# Patient Record
Sex: Male | Born: 1985 | Race: Black or African American | Hispanic: No | Marital: Single | Smoking: Never smoker
Health system: Southern US, Community
[De-identification: ages and names within clinical notes are randomized; demographics above are authoritative.]

## PROBLEM LIST (undated history)

## (undated) DIAGNOSIS — I10 Essential (primary) hypertension: Secondary | ICD-10-CM

## (undated) DIAGNOSIS — S82892A Other fracture of left lower leg, initial encounter for closed fracture: Secondary | ICD-10-CM

---
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
## 2006-04-14 ENCOUNTER — Emergency Department (HOSPITAL_COMMUNITY): Admission: EM | Admit: 2006-04-14 | Discharge: 2006-04-14 | Payer: Self-pay | Admitting: Emergency Medicine

## 2012-02-23 ENCOUNTER — Emergency Department (HOSPITAL_COMMUNITY): Payer: Self-pay

## 2012-02-23 ENCOUNTER — Encounter (HOSPITAL_COMMUNITY): Payer: Self-pay

## 2012-02-23 ENCOUNTER — Emergency Department (HOSPITAL_COMMUNITY)
Admission: EM | Admit: 2012-02-23 | Discharge: 2012-02-23 | Disposition: A | Discharge status: Home / Self Care | Payer: Self-pay | Attending: Emergency Medicine | Admitting: Emergency Medicine

## 2012-02-23 DIAGNOSIS — M25473 Effusion, unspecified ankle: Secondary | ICD-10-CM | POA: Insufficient documentation

## 2012-02-23 DIAGNOSIS — X58XXXA Exposure to other specified factors, initial encounter: Secondary | ICD-10-CM | POA: Insufficient documentation

## 2012-02-23 DIAGNOSIS — M25579 Pain in unspecified ankle and joints of unspecified foot: Secondary | ICD-10-CM | POA: Insufficient documentation

## 2012-02-23 DIAGNOSIS — S99929A Unspecified injury of unspecified foot, initial encounter: Secondary | ICD-10-CM | POA: Insufficient documentation

## 2012-02-23 DIAGNOSIS — Y9367 Activity, basketball: Secondary | ICD-10-CM | POA: Insufficient documentation

## 2012-02-23 DIAGNOSIS — Y9239 Other specified sports and athletic area as the place of occurrence of the external cause: Secondary | ICD-10-CM | POA: Insufficient documentation

## 2012-02-23 DIAGNOSIS — M25476 Effusion, unspecified foot: Secondary | ICD-10-CM | POA: Insufficient documentation

## 2012-02-23 DIAGNOSIS — S93409A Sprain of unspecified ligament of unspecified ankle, initial encounter: Secondary | ICD-10-CM | POA: Insufficient documentation

## 2012-02-23 DIAGNOSIS — S8990XA Unspecified injury of unspecified lower leg, initial encounter: Secondary | ICD-10-CM | POA: Insufficient documentation

## 2012-02-23 HISTORY — DX: Other fracture of left lower leg, initial encounter for closed fracture: S82.892A

## 2012-02-23 MED ORDER — IBUPROFEN 600 MG PO TABS: 600.0000 mg | ORAL_TABLET | Freq: Four times a day (QID) | ORAL | Status: AC | PRN

## 2012-02-23 MED ORDER — IBUPROFEN 200 MG PO TABS
600.0000 mg | ORAL_TABLET | Freq: Once | ORAL | Status: AC
  Administered 2012-02-23: 600 mg via ORAL
  Filled 2012-02-23: qty 3

## 2012-02-23 NOTE — Discharge Instructions (Signed)
Take motrin as need for pain. Elevate foot/ankle. Icepack/cold to sore area. Wear brace for comfort/support for the next few days.  Follow up with primary care doctor in 1 week if symptoms fail to improve/resolve.  Your blood pressure is high today - follow up with primary care doctor in 1-2 weeks.  Return to ER if worse, severe pain, new symptoms, other concern.      Ankle Sprain An ankle sprain is an injury to the strong, fibrous tissues (ligaments) that hold the bones of your ankle joint together.  CAUSES Ankle sprain usually is caused by a fall or by twisting your ankle. People who participate in sports are more prone to these types of injuries.  SYMPTOMS  Symptoms of ankle sprain include:  Pain in your ankle. The pain may be present at rest or only when you are trying to stand or walk.   Swelling.   Bruising. Bruising may develop immediately or within 1 to 2 days after your injury.   Difficulty standing or walking.  DIAGNOSIS  Your caregiver will ask you details about your injury and perform a physical exam of your ankle to determine if you have an ankle sprain. During the physical exam, your caregiver will press and squeeze specific areas of your foot and ankle. Your caregiver will try to move your ankle in certain ways. An X-ray exam may be done to be sure a bone was not broken or a ligament did not separate from one of the bones in your ankle (avulsion).  TREATMENT  Certain types of braces can help stabilize your ankle. Your caregiver can make a recommendation for this. Your caregiver may recommend the use of medication for pain. If your sprain is severe, your caregiver may refer you to a surgeon who helps to restore function to parts of your skeletal system (orthopedist) or a physical therapist. HOME CARE INSTRUCTIONS  Apply ice to your injury for 1 to 2 days or as directed by your caregiver. Applying ice helps to reduce inflammation and pain.  Put ice in a plastic bag.    Place a towel between your skin and the bag.   Leave the ice on for 15 to 20 minutes at a time, every 2 hours while you are awake.   Take over-the-counter or prescription medicines for pain, discomfort, or fever only as directed by your caregiver.   Keep your injured leg elevated, when possible, to lessen swelling.   If your caregiver recommends crutches, use them as instructed. Gradually, put weight on the affected ankle. Continue to use crutches or a cane until you can walk without feeling pain in your ankle.   If you have a plaster splint, wear the splint as directed by your caregiver. Do not rest it on anything harder than a pillow the first 24 hours. Do not put weight on it. Do not get it wet. You may take it off to take a shower or bath.   You may have been given an elastic bandage to wear around your ankle to provide support. If the elastic bandage is too tight (you have numbness or tingling in your foot or your foot becomes cold and blue), adjust the bandage to make it comfortable.   If you have an air splint, you may blow more air into it or let air out to make it more comfortable. You may take your splint off at night and before taking a shower or bath.   Wiggle your toes in the splint several  times per day if you are able.  SEEK MEDICAL CARE IF:   You have an increase in bruising, swelling, or pain.   Your toes feel cold.   Pain relief is not achieved with medication.  SEEK IMMEDIATE MEDICAL CARE IF: Your toes are numb or blue or you have severe pain. MAKE SURE YOU:   Understand these instructions.   Will watch your condition.   Will get help right away if you are not doing well or get worse.  Document Released: 09/29/2005 Document Revised: 09/18/2011 Document Reviewed: 05/03/2008 [facility] Patient Information 2012 [place], [place].     Hypertension As your heart beats, it forces blood through your arteries. This force is your blood pressure. If the pressure is  too high, it is called hypertension (HTN) or high blood pressure. HTN is dangerous because you may have it and not know it. High blood pressure may mean that your heart has to work harder to pump blood. Your arteries may be narrow or stiff. The extra work puts you at risk for heart disease, stroke, and other problems.  Blood pressure consists of two numbers, a higher number over a lower, 110/72, for example. It is stated as "110 over 72." The ideal is below 120 for the top number (systolic) and under 80 for the bottom (diastolic). Write down your blood pressure today. You should pay close attention to your blood pressure if you have certain conditions such as:  Heart failure.   Prior heart attack.   Diabetes   Chronic kidney disease.   Prior stroke.   Multiple risk factors for heart disease.  To see if you have HTN, your blood pressure should be measured while you are seated with your arm held at the level of the heart. It should be measured at least twice. A one-time elevated blood pressure reading (especially in the Emergency Department) does not mean that you need treatment. There may be conditions in which the blood pressure is different between your right and left arms. It is important to see your caregiver soon for a recheck. Most people have essential hypertension which means that there is not a specific cause. This type of high blood pressure may be lowered by changing lifestyle factors such as:  Stress.   Smoking.   Lack of exercise.   Excessive weight.   Drug/tobacco/alcohol use.   Eating less salt.  Most people do not have symptoms from high blood pressure until it has caused damage to the body. Effective treatment can often prevent, delay or reduce that damage. TREATMENT  When a cause has been identified, treatment for high blood pressure is directed at the cause. There are a large number of medications to treat HTN. These fall into several categories, and your caregiver  will help you select the medicines that are best for you. Medications may have side effects. You should review side effects with your caregiver. If your blood pressure stays high after you have made lifestyle changes or started on medicines,   Your medication(s) may need to be changed.   Other problems may need to be addressed.   Be certain you understand your prescriptions, and know how and when to take your medicine.   Be sure to follow up with your caregiver within the time frame advised (usually within two weeks) to have your blood pressure rechecked and to review your medications.   If you are taking more than one medicine to lower your blood pressure, make sure you know how and  at what times they should be taken. Taking two medicines at the same time can result in blood pressure that is too low.  SEEK IMMEDIATE MEDICAL CARE IF:  You develop a severe headache, blurred or changing vision, or confusion.   You have unusual weakness or numbness, or a faint feeling.   You have severe chest or abdominal pain, vomiting, or breathing problems.  MAKE SURE YOU:   Understand these instructions.   Will watch your condition.   Will get help right away if you are not doing well or get worse.  Document Released: 09/29/2005 Document Revised: 09/18/2011 Document Reviewed: 05/19/2008 [facility] Patient Information 2012 [place], [place].

## 2012-02-23 NOTE — ED Provider Notes (Signed)
History     CSN: [id number]  Arrival date & time 02/23/12  1219   First MD Initiated Contact with Patient 02/23/12 1335      Chief Complaint  Patient presents with  . Ankle Injury    (Consider location/radiation/quality/duration/timing/severity/associated sxs/prior treatment) Patient is a 26 y.o. male presenting with lower extremity injury. The history is provided by the patient.  Ankle Injury  pt c/o left ankle injury yesterday playing basketball. Constant, dull, non radiating, pain laterally. Worse w walking and palpation. No knee pain. Denies other injury. Skin intact. No numbness/weakness.     Past Medical History  Diagnosis Date  . Ankle fracture, left     History reviewed. No pertinent past surgical history.  History reviewed. No pertinent family history.  History  Substance Use Topics  . Smoking status: Never Smoker   . Smokeless tobacco: Not on file  . Alcohol Use: No      Review of Systems  Constitutional: Negative for fever.  Skin: Negative for wound.  Neurological: Negative for numbness.    Allergies  Review of patient's allergies indicates no known allergies.  Home Medications   Current Outpatient Rx  Name Route Sig Dispense Refill  . IBUPROFEN 200 MG PO TABS Oral Take 800 mg by mouth every 6 (six) hours as needed. pain    . ADULT MULTIVITAMIN W/MINERALS CH Oral Take 1 tablet by mouth daily.      BP 149/98  Pulse 76  Temp(Src) 98.2 F (36.8 C) (Oral)  Resp 12  SpO2 96%  Physical Exam  Nursing note and vitals reviewed. Constitutional: He appears well-developed and well-nourished. No distress.  HENT:  Head: Atraumatic.  Neck: Neck supple. No tracheal deviation present.  Cardiovascular: Normal rate.   Pulmonary/Chest: Effort normal. No accessory muscle usage. No respiratory distress.  Musculoskeletal: Normal range of motion.       sts and tenderness lateral malleolus left ankle. Dp/pt 2+. Normal cap refill distally. Ankle stable. No  tenderness 5th nt. No prox tib fib or knee tenderness. Good rom.   Neurological: He is alert.       Left foot nvi.   Skin: Skin is warm and dry.  Psychiatric: He has a normal mood and affect.    ED Course  Procedures (including critical care time)  Labs Reviewed - No data to display Dg Ankle Complete Left  02/23/2012  *RADIOLOGY REPORT*  Clinical Data: Twisted.  Pain and swelling.  LEFT ANKLE COMPLETE - 3+ VIEW  Comparison: 04/14/2006  Findings: There is a flat foot.  There are some degenerative changes in the midfoot.  There is some calcification projected over the lateral calcaneal region that probably relates to an old healed lateral ligamentous injury.  No acute fracture is evident.  IMPRESSION: No acute fracture.  Calcification lateral to the calcaneus probably relates to old healed lateral ligamentous injury.  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  Original Report Authenticated By: Thomasenia Sales, M.D.        MDM  Xrays. Icepack. Aso. Discussed xrays w pt.   Motrin po.           Suzi Roots, MD 02/23/12 [phone]

## 2012-02-23 NOTE — ED Notes (Signed)
Pt. States he was playing basketball yesterday and "rolled my ankle".  Left ankle swollen and tender to touch. Pt. Reports using ice, ibuprofen, and elevation without relief.

## 2013-09-27 IMAGING — CR DG ANKLE COMPLETE 3+V*L*
3 series · 3 of 3 positions shown · non-contrast
Comparison: 04/14/2006

CLINICAL DATA: Twisted.  Pain and swelling.

LEFT ANKLE COMPLETE - 3+ VIEW

[x ankle ap left]
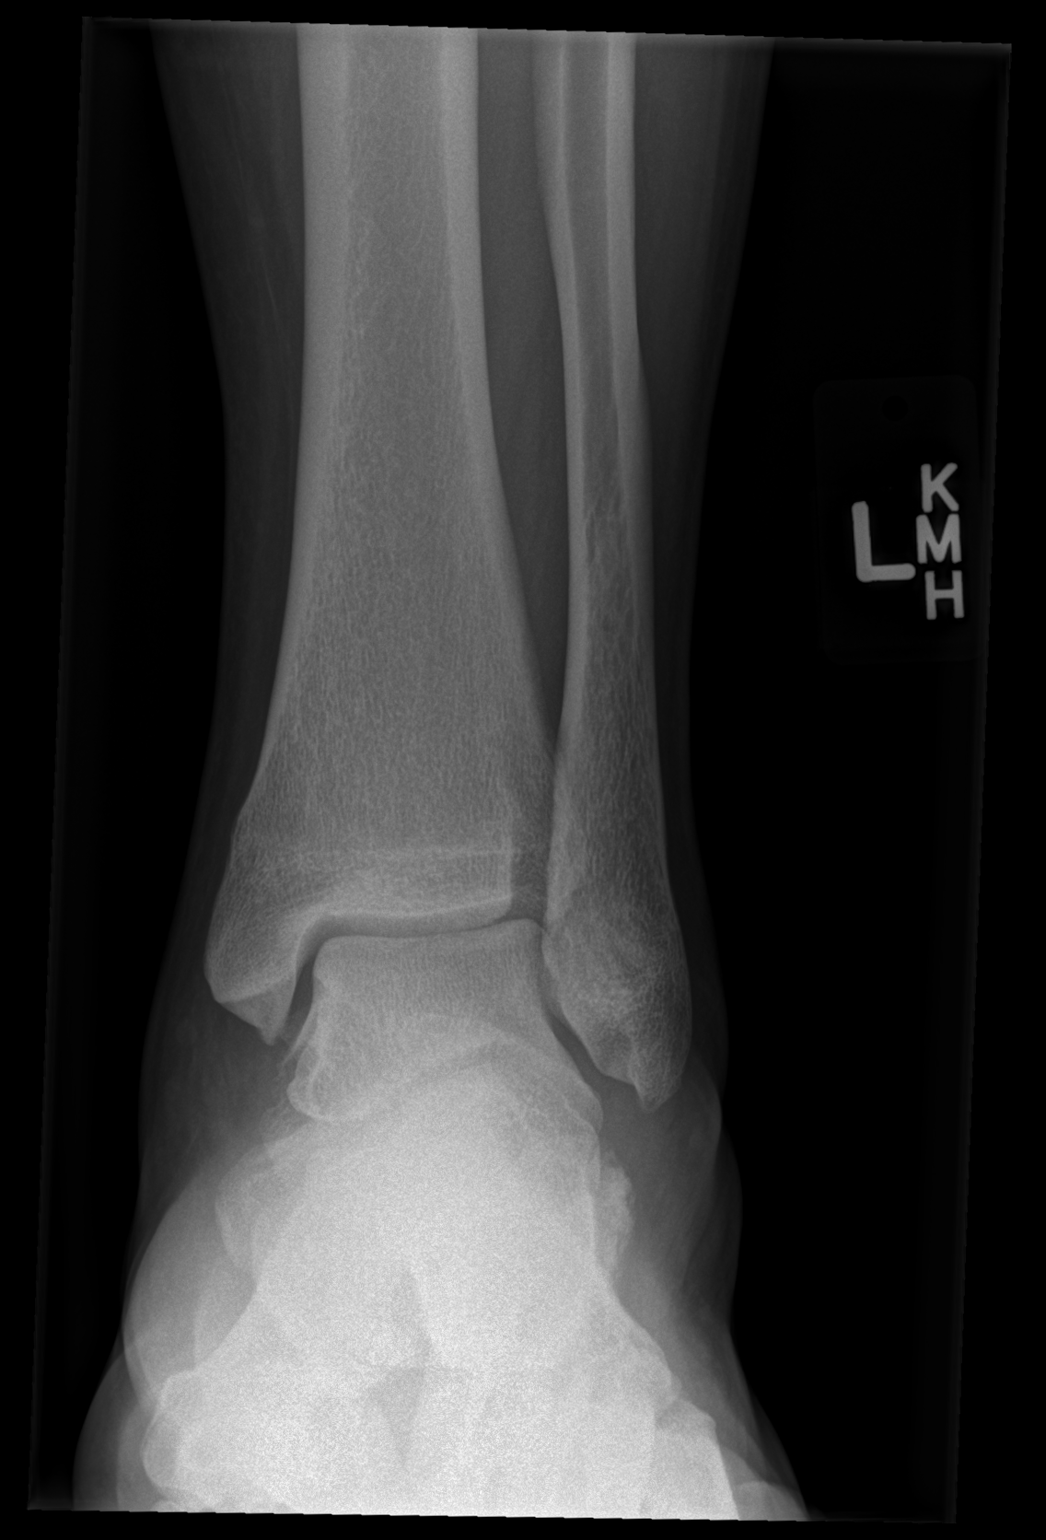

[x ankle obl left]
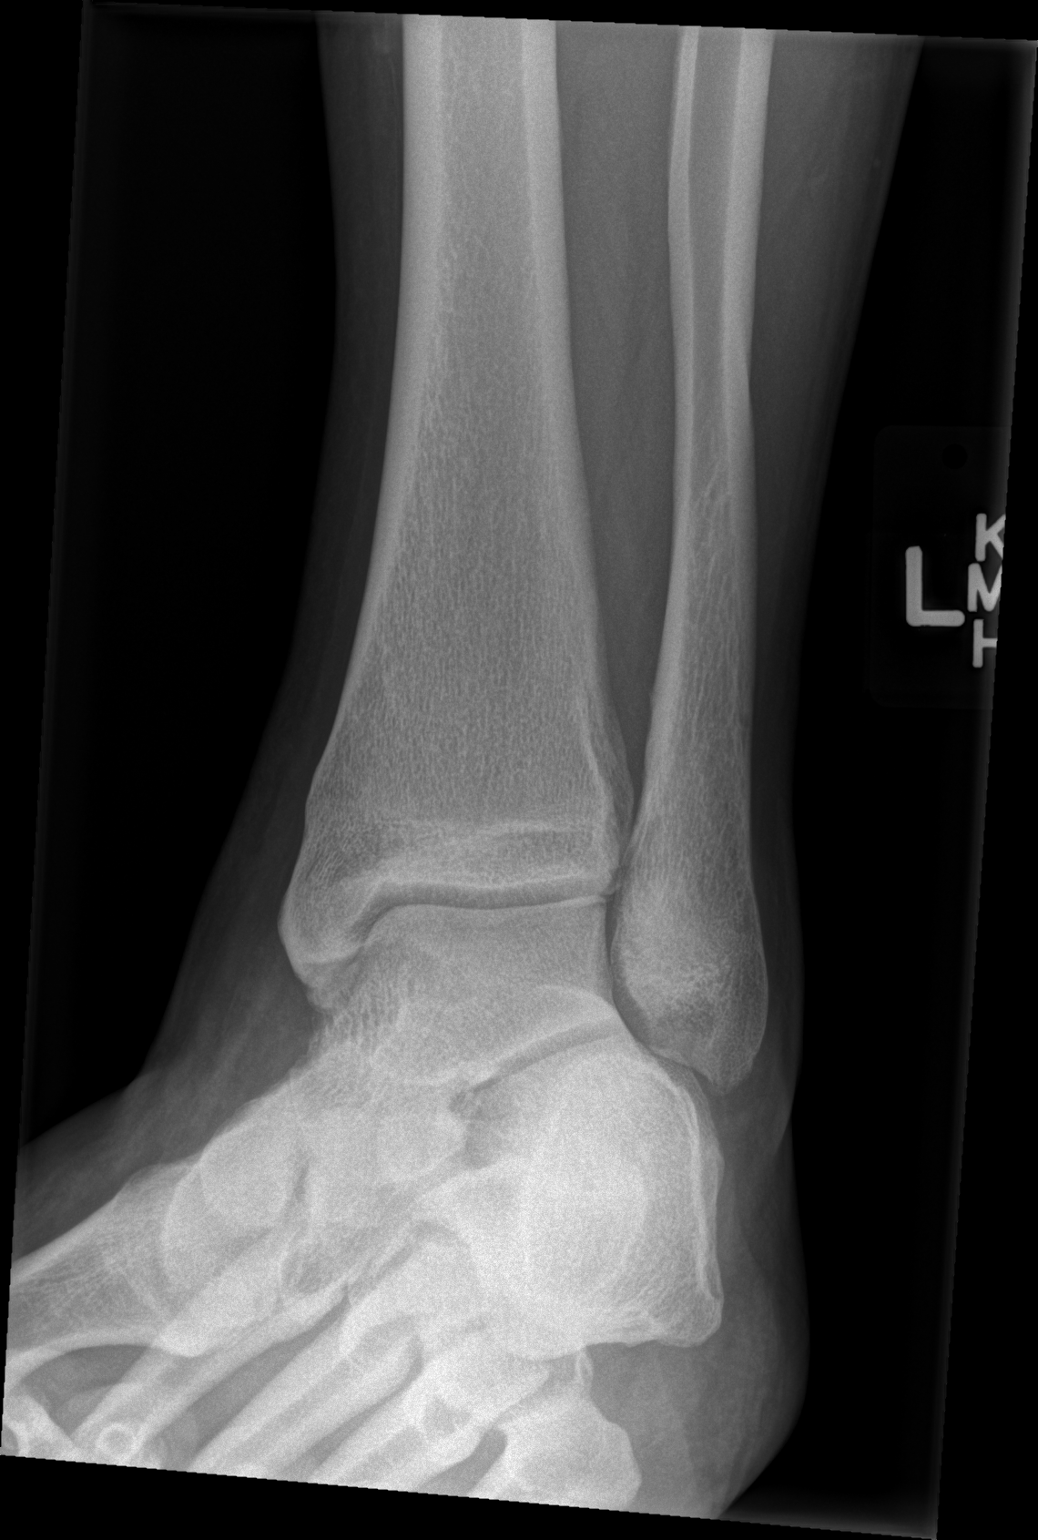

[x ankle lat left]
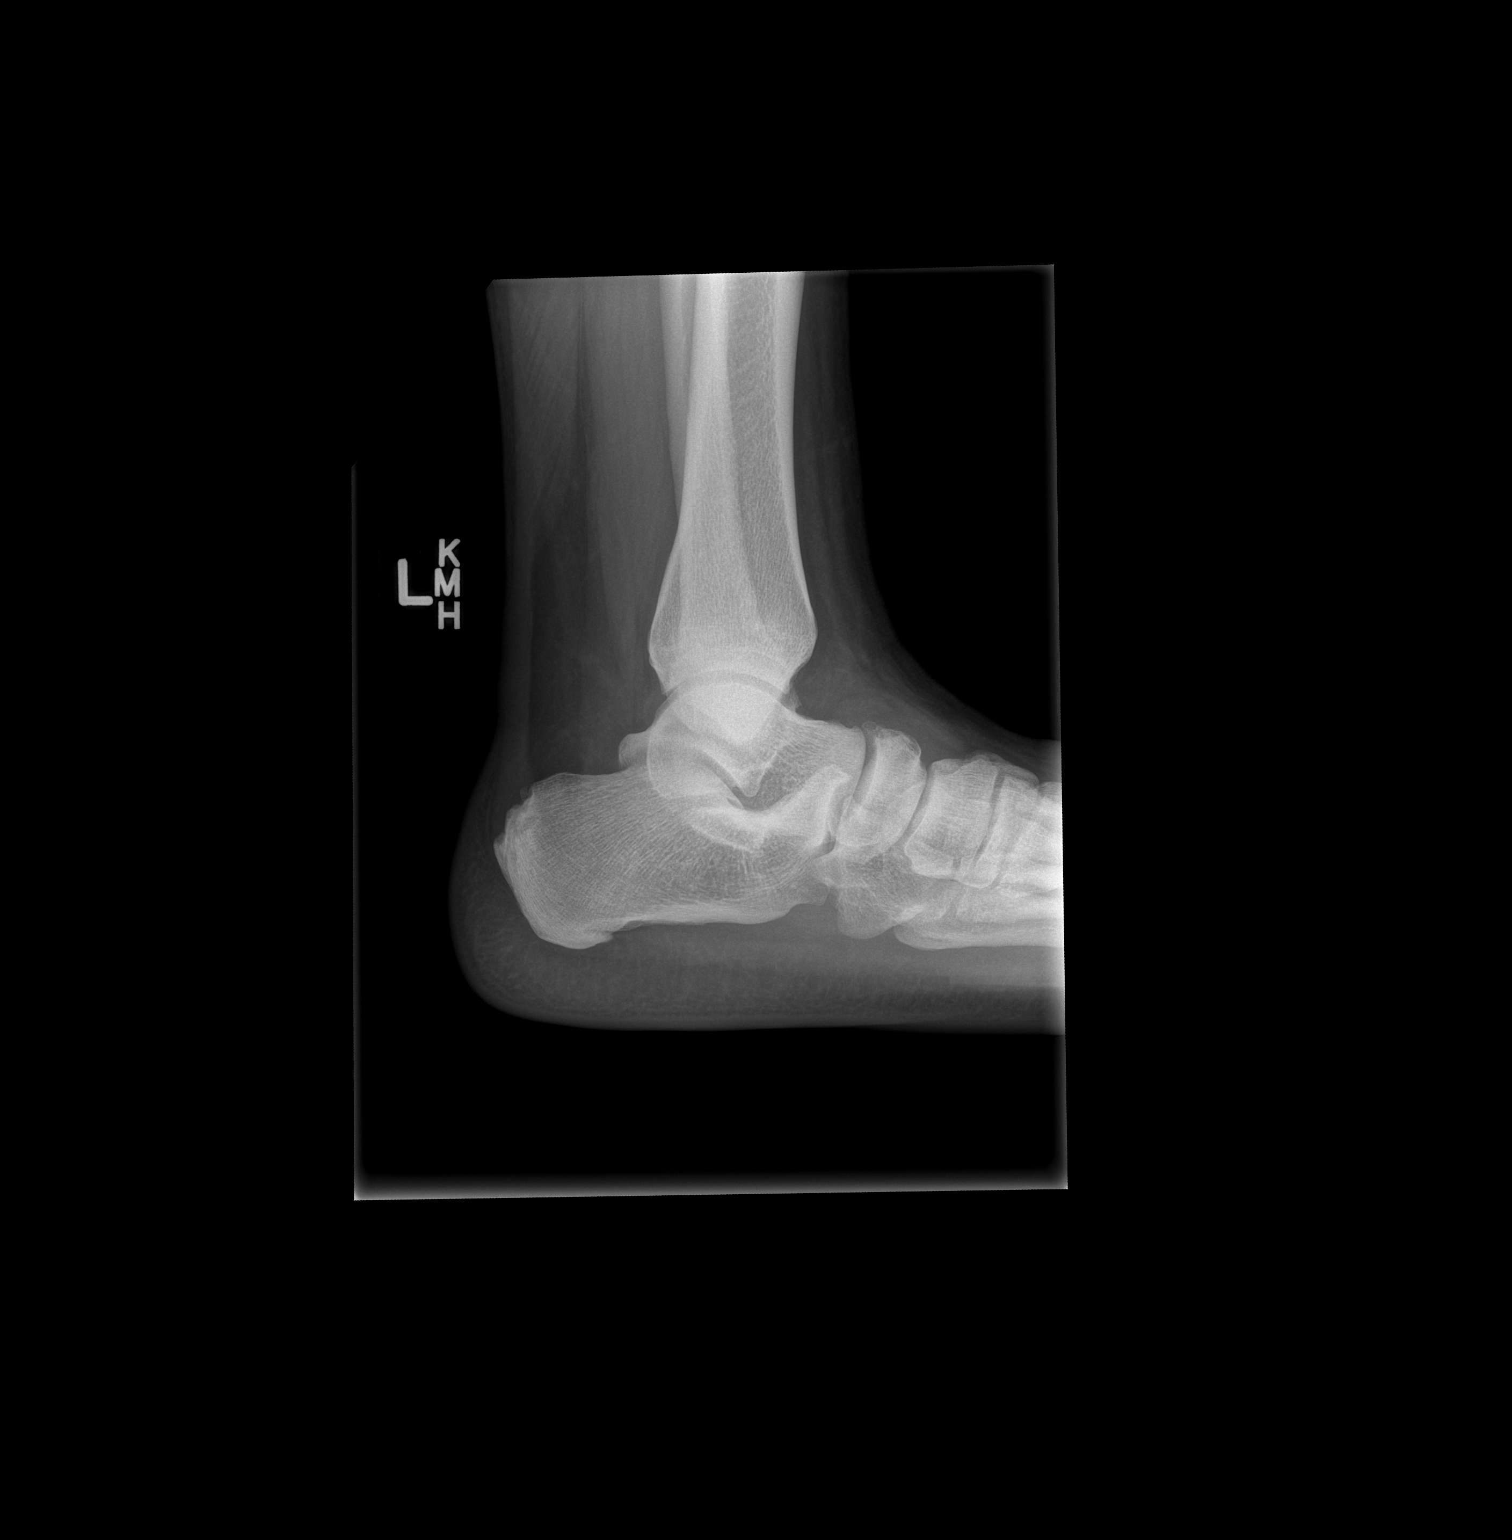

[3 of 3 positions shown; findings below may reference images not displayed]

FINDINGS: There is a flat foot.  There are some degenerative
changes in the midfoot.  There is some calcification projected over
the lateral calcaneal region that probably relates to an old healed
lateral ligamentous injury.  No acute fracture is evident.
IMPRESSION: No acute fracture.  Calcification lateral to the calcaneus probably
relates to old healed lateral ligamentous injury.  Flat foot with
mid foot degenerative changes.

## 2019-02-24 ENCOUNTER — Other Ambulatory Visit: Payer: Self-pay

## 2019-02-24 ENCOUNTER — Encounter (HOSPITAL_COMMUNITY): Payer: Self-pay

## 2019-02-24 ENCOUNTER — Emergency Department (HOSPITAL_COMMUNITY)
Admission: EM | Admit: 2019-02-24 | Discharge: 2019-02-24 | Disposition: A | Discharge status: Home / Self Care | Payer: BLUE CROSS/BLUE SHIELD | Attending: Emergency Medicine | Admitting: Emergency Medicine

## 2019-02-24 DIAGNOSIS — R300 Dysuria: Secondary | ICD-10-CM | POA: Insufficient documentation

## 2019-02-24 DIAGNOSIS — R350 Frequency of micturition: Secondary | ICD-10-CM | POA: Insufficient documentation

## 2019-02-24 DIAGNOSIS — Z79899 Other long term (current) drug therapy: Secondary | ICD-10-CM | POA: Insufficient documentation

## 2019-02-24 DIAGNOSIS — M545 Low back pain: Secondary | ICD-10-CM | POA: Insufficient documentation

## 2019-02-24 DIAGNOSIS — Z202 Contact with and (suspected) exposure to infections with a predominantly sexual mode of transmission: Secondary | ICD-10-CM | POA: Diagnosis present

## 2019-02-24 DIAGNOSIS — Z711 Person with feared health complaint in whom no diagnosis is made: Secondary | ICD-10-CM

## 2019-02-24 LAB — URINALYSIS, ROUTINE W REFLEX MICROSCOPIC
Bilirubin Urine: NEGATIVE
Glucose, UA: NEGATIVE mg/dL
Hgb urine dipstick: NEGATIVE
Ketones, ur: NEGATIVE mg/dL
Leukocytes,Ua: NEGATIVE
Nitrite: NEGATIVE
Protein, ur: NEGATIVE mg/dL
Specific Gravity, Urine: 1.012 (ref 1.005–1.030)
pH: 5 (ref 5.0–8.0)

## 2019-02-24 LAB — CBG MONITORING, ED: Glucose-Capillary: 80 mg/dL (ref 70–99)

## 2019-02-24 MED ORDER — NAPROXEN 500 MG PO TABS: 500.0000 mg | ORAL_TABLET | Freq: Two times a day (BID) | ORAL | 0 refills | Status: AC

## 2019-02-24 MED ORDER — LIDOCAINE HCL 1 % IJ SOLN
INTRAMUSCULAR | Status: AC
  Administered 2019-02-24: 20 mL
  Filled 2019-02-24: qty 20

## 2019-02-24 MED ORDER — AZITHROMYCIN 250 MG PO TABS
1000.0000 mg | ORAL_TABLET | Freq: Once | ORAL | Status: AC
  Administered 2019-02-24: 14:00:00 1000 mg via ORAL
  Filled 2019-02-24: qty 4

## 2019-02-24 MED ORDER — CEFTRIAXONE SODIUM 250 MG IJ SOLR
250.0000 mg | Freq: Once | INTRAMUSCULAR | Status: AC
  Administered 2019-02-24: 250 mg via INTRAMUSCULAR
  Filled 2019-02-24: qty 250

## 2019-02-24 NOTE — ED Notes (Signed)
Pt d/c home per MD order. Discharge summary reviewed. Pt verbalizes understanding. Ambulatory off unit. No s/s of distress noted.

## 2019-02-24 NOTE — ED Notes (Signed)
This RN chaperoned Hina, PA for genital exam.

## 2019-02-24 NOTE — Discharge Instructions (Addendum)
We will contact you with the results of your remaining lab work when it is available. Take the medication as needed for discomfort. Return to the ED for worsening symptoms, if you develop a fever, rash in the area, testicular pain or swelling.

## 2019-02-24 NOTE — ED Provider Notes (Signed)
[facility]-EMERGENCY DEPT Provider Note   CSN: [id number] Arrival date & time: 02/24/19  1226    History   Chief Complaint Chief Complaint  Patient presents with  . Urinary Frequency    Requesting STD check    HPI Derrick Conway is a 33 y.o. male who presents to ED for STD check.  States that he is having urinary discomfort and frequency.  He does admit to unprotected sexual intercourse with a male partner recently.  He is unsure if she has tested positive for STDs.  States that he will sometimes have pain in his lower back.  Denies any hematuria, testicular pain or swelling, rashes or lesions, fever.     HPI  Past Medical History:  Diagnosis Date  . Ankle fracture, left     There are no active problems to display for this patient.   History reviewed. No pertinent surgical history.      Home Medications    Prior to Admission medications   Medication Sig Start Date End Date Taking? Authorizing Provider  ibuprofen (ADVIL,MOTRIN) 200 MG tablet Take 800 mg by mouth every 6 (six) hours as needed. pain    [provider]  Multiple Vitamin (MULITIVITAMIN WITH MINERALS) TABS Take 1 tablet by mouth daily.    [provider]  naproxen (NAPROSYN) 500 MG tablet Take 1 tablet (500 mg total) by mouth 2 (two) times daily. 02/24/19   Dietrich Pates, PA-C    Family History History reviewed. No pertinent family history.  Social History Social History   Tobacco Use  . Smoking status: Never Smoker  Substance Use Topics  . Alcohol use: No  . Drug use: No     Allergies   Patient has no known allergies.   Review of Systems Review of Systems  Constitutional: Negative for chills and fever.  Genitourinary: Positive for dysuria and frequency. Negative for discharge, penile swelling and testicular pain.  Skin: Negative for rash.     Physical Exam Updated Vital Signs BP (!) 176/120 (BP Location: Left Arm)   Pulse 71   Temp 98.5 F  (36.9 C) (Oral)   Resp 18   Ht 5\' 11"  (1.803 m)   Wt (!) 149.7 kg   SpO2 100%   BMI 46.03 kg/m   Physical Exam Vitals signs and nursing note reviewed. Exam conducted with a chaperone present.  Constitutional:      General: He is not in acute distress.    Appearance: He is well-developed. He is not diaphoretic.  HENT:     Head: Normocephalic and atraumatic.  Eyes:     General: No scleral icterus.    Conjunctiva/sclera: Conjunctivae normal.  Neck:     Musculoskeletal: Normal range of motion.  Pulmonary:     Effort: Pulmonary effort is normal. No respiratory distress.  Genitourinary:    Penis: Circumcised. No tenderness or discharge.      Scrotum/Testes:        Right: Tenderness or swelling not present.        Left: Tenderness or swelling not present.     Comments: Normal male genitalia noted. Penis, scrotum, and testicles without swelling, lesions, rashes, or tenderness present. No penile discharge noted. Cremasteric reflex intact. RN served as Biomedical engineer during the exam.  Skin:    Findings: No rash.  Neurological:     Mental Status: He is alert.      ED Treatments / Results  Labs (all labs ordered are listed, but only abnormal results  are displayed) Labs Reviewed  URINALYSIS, ROUTINE W REFLEX MICROSCOPIC  CBG MONITORING, ED  GC/CHLAMYDIA PROBE AMP [facility]    EKG None  Radiology No results found.  Procedures Procedures (including critical care time)  Medications Ordered in ED Medications  cefTRIAXone (ROCEPHIN) injection 250 mg (has no administration in time range)  azithromycin (ZITHROMAX) tablet 1,000 mg (has no administration in time range)     Initial Impression / Assessment and Plan / ED Course  I have reviewed the triage vital signs and the nursing notes.  Pertinent labs & imaging results that were available during my care of the patient were reviewed by me and considered in my medical decision making (see chart for details).         33 year old male presents to ED requesting STD check.  He notes dysuria and urinary frequency after having unprotected sexual intercourse with a male partner.  Denies any testicular pain, symptoms, rashes.  No abnormalities noted on physical exam.  Urinalysis unremarkable.  Will treat for gonorrhea and chlamydia and await remainder results.  Patient is hemodynamically stable, in NAD, and able to ambulate in the ED. Evaluation does not show pathology that would require ongoing emergent intervention or inpatient treatment. I explained the diagnosis to the patient. Pain has been managed and has no complaints prior to discharge. Patient is comfortable with above plan and is stable for discharge at this time. All questions were answered prior to disposition. Strict return precautions for returning to the ED were discussed. Encouraged follow up with PCP.   An After Visit Summary was printed and given to the patient.   Portions of this note were generated with Scientist, clinical (histocompatibility and immunogenetics)Dragon dictation software. Dictation errors may occur despite best attempts at proofreading.   Final Clinical Impressions(s) / ED Diagnoses   Final diagnoses:  Concern about STD in male without diagnosis    ED Discharge Orders         Ordered    naproxen (NAPROSYN) 500 MG tablet  2 times daily     02/24/19 1350           Dietrich PatesKhatri, Macalister Arnaud, PA-C 02/24/19 1352    Melene PlanFloyd, Dan, DO 02/24/19 1410

## 2019-02-24 NOTE — ED Notes (Signed)
Bed: WTR7 Expected date:  Expected time:  Means of arrival:  Comments: 

## 2019-02-24 NOTE — ED Triage Notes (Addendum)
Pt to ED, Reports was treated for STD 7 months , ever since treatment reports frequency and pressure with urination. Denies burning, blood. 2 weeks ago started lower back and lower abdominal pain. Requesting STD check, reports unprotected intercourse with one male partner.

## 2019-02-25 LAB — GC/CHLAMYDIA PROBE AMP (~~LOC~~) NOT AT ARMC
Chlamydia: NEGATIVE
Neisseria Gonorrhea: NEGATIVE

## 2019-08-12 ENCOUNTER — Other Ambulatory Visit: Payer: Self-pay

## 2019-08-12 DIAGNOSIS — Z20822 Contact with and (suspected) exposure to covid-19: Secondary | ICD-10-CM

## 2019-08-13 LAB — NOVEL CORONAVIRUS, NAA: SARS-CoV-2, NAA: NOT DETECTED

## 2019-09-12 ENCOUNTER — Other Ambulatory Visit: Payer: Self-pay

## 2019-09-12 DIAGNOSIS — Z20822 Contact with and (suspected) exposure to covid-19: Secondary | ICD-10-CM

## 2019-09-13 LAB — NOVEL CORONAVIRUS, NAA: SARS-CoV-2, NAA: NOT DETECTED

## 2019-10-11 ENCOUNTER — Ambulatory Visit (HOSPITAL_COMMUNITY)
Admission: EM | Admit: 2019-10-11 | Discharge: 2019-10-11 | Disposition: A | Discharge status: Home / Self Care | Payer: Self-pay

## 2019-10-11 ENCOUNTER — Other Ambulatory Visit: Payer: Self-pay

## 2019-10-11 NOTE — ED Notes (Signed)
Leaves facility, wants to get tested elsewhere.

## 2020-02-24 ENCOUNTER — Encounter (HOSPITAL_COMMUNITY): Payer: Self-pay

## 2020-02-24 ENCOUNTER — Emergency Department (HOSPITAL_COMMUNITY)
Admission: EM | Admit: 2020-02-24 | Discharge: 2020-02-24 | Disposition: A | Discharge status: Home / Self Care | Payer: Self-pay | Attending: Emergency Medicine | Admitting: Emergency Medicine

## 2020-02-24 ENCOUNTER — Other Ambulatory Visit: Payer: Self-pay

## 2020-02-24 DIAGNOSIS — I10 Essential (primary) hypertension: Secondary | ICD-10-CM | POA: Insufficient documentation

## 2020-02-24 DIAGNOSIS — N342 Other urethritis: Secondary | ICD-10-CM | POA: Insufficient documentation

## 2020-02-24 HISTORY — DX: Essential (primary) hypertension: I10

## 2020-02-24 LAB — URINALYSIS, ROUTINE W REFLEX MICROSCOPIC
Bilirubin Urine: NEGATIVE
Glucose, UA: NEGATIVE mg/dL
Hgb urine dipstick: NEGATIVE
Ketones, ur: NEGATIVE mg/dL
Nitrite: NEGATIVE
Protein, ur: NEGATIVE mg/dL
Specific Gravity, Urine: 1.023 (ref 1.005–1.030)
pH: 5 (ref 5.0–8.0)

## 2020-02-24 LAB — I-STAT CHEM 8, ED
BUN: 14 mg/dL (ref 6–20)
Calcium, Ion: 1.2 mmol/L (ref 1.15–1.40)
Chloride: 101 mmol/L (ref 98–111)
Creatinine, Ser: 1.1 mg/dL (ref 0.61–1.24)
Glucose, Bld: 88 mg/dL (ref 70–99)
HCT: 44 % (ref 39.0–52.0)
Hemoglobin: 15 g/dL (ref 13.0–17.0)
Potassium: 4.1 mmol/L (ref 3.5–5.1)
Sodium: 140 mmol/L (ref 135–145)
TCO2: 30 mmol/L (ref 22–32)

## 2020-02-24 LAB — HIV ANTIBODY (ROUTINE TESTING W REFLEX): HIV Screen 4th Generation wRfx: NONREACTIVE

## 2020-02-24 MED ORDER — CLONIDINE HCL 0.1 MG PO TABS
0.1000 mg | ORAL_TABLET | Freq: Once | ORAL | Status: AC
  Administered 2020-02-24: 0.1 mg via ORAL
  Filled 2020-02-24: qty 1

## 2020-02-24 MED ORDER — DOXYCYCLINE HYCLATE 100 MG PO TABS
100.0000 mg | ORAL_TABLET | Freq: Once | ORAL | Status: AC
  Administered 2020-02-24: 100 mg via ORAL
  Filled 2020-02-24: qty 1

## 2020-02-24 MED ORDER — CEFTRIAXONE SODIUM 1 G IJ SOLR
500.0000 mg | Freq: Once | INTRAMUSCULAR | Status: AC
  Administered 2020-02-24: 500 mg via INTRAMUSCULAR
  Filled 2020-02-24: qty 10

## 2020-02-24 MED ORDER — STERILE WATER FOR INJECTION IJ SOLN
INTRAMUSCULAR | Status: AC
  Filled 2020-02-24: qty 10

## 2020-02-24 MED ORDER — DOXYCYCLINE HYCLATE 100 MG PO CAPS: 100.0000 mg | ORAL_CAPSULE | Freq: Two times a day (BID) | ORAL | 0 refills | Status: AC

## 2020-02-24 MED ORDER — AMLODIPINE BESYLATE 5 MG PO TABS: 5.0000 mg | ORAL_TABLET | Freq: Every day | ORAL | 0 refills | Status: AC

## 2020-02-24 NOTE — ED Triage Notes (Addendum)
Patient c/o cloudy penile drainage and dysuria x 3-4 days.  Patient is hypertensive in triage 188/132. Patient states he has ben out of his BP meds x 8 months.

## 2020-02-24 NOTE — Discharge Instructions (Signed)
You have been diagnosed today with Urethritis and Asymptomatic Hypertension.  At this time there does not appear to be the presence of an emergent medical condition, however there is always the potential for conditions to change. Please read and follow the below instructions.  Please return to the Emergency Department immediately for any new or worsening symptoms. Please be sure to follow up with your Primary Care Provider within one week regarding your visit today; please call their office to schedule an appointment even if you are feeling better for a follow-up visit. You have been started on treatment presumptively today for gonorrhea and chlamydia. Take you medication doxycycline as prescribed until complete. You have been tested today for gonorrhea and chlamydia as well as HIV and syphilis. These results will be available in approximately 3 days. You may check your MyChart account for results. Please inform all sexual partners of positive results and that they should be tested and treated as well. Please wait 2 weeks and be sure that you and your partners are symptom free before returning to sexual activity. Please use protection with every sexual encounter. Follow Up: Please followup with your primary doctor in 3 days for discussion of your diagnoses and further evaluation after today's visit; if you do not have a primary care doctor use the resource guide provided to find one; Please return to the ER for worsening symptoms, high fevers or persistent vomiting. Please take the blood pressure medication Amlodipine as prescribed. Please follow-up with your primary care provider for a recheck of your blood pressure within one week. You may call [facility] to establish a primary care provider if you do not already have one.  Get help right away if you: Get a very bad headache. Start to feel mixed up (confused). Feel weak or numb. Feel faint. Have very bad pain in  your: Chest. Belly (abdomen). Throw up more than once. Have trouble breathing. You have testicular pain or swelling You have any new/concerning or worsening of symptoms   Please read the additional information packets attached to your discharge summary.  Do not take your medicine if  develop an itchy rash, swelling in your mouth or lips, or difficulty breathing; call 911 and seek immediate emergency medical attention if this occurs.  Note: Portions of this text may have been transcribed using voice recognition software. Every effort was made to ensure accuracy; however, inadvertent computerized transcription errors may still be present.

## 2020-02-24 NOTE — ED Provider Notes (Signed)
[facility]-EMERGENCY DEPT Provider Note   CSN: [id number] Arrival date & time: 02/24/20  6761     History Chief Complaint  Patient presents with  . Dysuria  . penile drainage    Derrick Conway is a 34 y.o. male history of hypertension otherwise healthy no daily medication use.  Patient presents today for concern of penile drainage and dysuria x3 days.  Reports he is sexually active without protection.  Patient reports mild burning sensation of the tip of the urethra only with urinating improves when he stops urinating ongoing for 3 days nonradiating.  Additionally saw a small amount of white drainage from the tip of his penis this morning.  Patient denies fever/chills, headache, chest pain, shortness of breath, abdominal pain, nausea/vomiting, testicular pain/swelling, hematuria, rash, arthralgias or any additional concerns.  HPI     Past Medical History:  Diagnosis Date  . Ankle fracture, left   . Hypertension     There are no problems to display for this patient.   History reviewed. No pertinent surgical history.     Family History  Problem Relation Age of Onset  . Lupus Mother     Social History   Tobacco Use  . Smoking status: Never Smoker  . Smokeless tobacco: Never Used  Substance Use Topics  . Alcohol use: No  . Drug use: Yes    Types: Marijuana    Home Medications Prior to Admission medications   Medication Sig Start Date End Date Taking? Authorizing Provider  ibuprofen (ADVIL,MOTRIN) 200 MG tablet Take 800 mg by mouth every 6 (six) hours as needed. pain   Yes [provider]  amLODipine (NORVASC) 5 MG tablet Take 1 tablet (5 mg total) by mouth daily. 02/24/20 03/25/20  Harlene Salts A, PA-C  doxycycline (VIBRAMYCIN) 100 MG capsule Take 1 capsule (100 mg total) by mouth 2 (two) times daily for 7 days. 02/24/20 03/02/20  Harlene Salts A, PA-C  naproxen (NAPROSYN) 500 MG tablet Take 1 tablet (500 mg total) by mouth 2  (two) times daily. Patient not taking: Reported on 02/24/2020 02/24/19   Dietrich Pates, PA-C    Allergies    Patient has no known allergies.  Review of Systems   Review of Systems  Constitutional: Negative.  Negative for chills and fever.  Respiratory: Negative.  Negative for shortness of breath.   Cardiovascular: Negative.  Negative for chest pain.  Gastrointestinal: Negative.  Negative for abdominal pain, nausea and vomiting.  Genitourinary: Positive for discharge and dysuria. Negative for genital sores, hematuria, scrotal swelling and testicular pain.  Musculoskeletal: Negative.  Negative for arthralgias and myalgias.  Skin: Negative.  Negative for rash.    Physical Exam Updated Vital Signs BP (!) 190/135 (BP Location: Right Arm)   Pulse 70   Temp 98.2 F (36.8 C) (Oral)   Resp 18   Ht 5\' 11"  (1.803 m)   Wt (!) 148.8 kg   SpO2 99%   BMI 45.75 kg/m   Physical Exam Constitutional:      General: He is not in acute distress.    Appearance: Normal appearance. He is well-developed. He is obese. He is not ill-appearing or diaphoretic.  HENT:     Head: Normocephalic and atraumatic.     Right Ear: External ear normal.     Left Ear: External ear normal.     Nose: Nose normal.  Eyes:     General: Vision grossly intact. Gaze aligned appropriately.     Pupils: Pupils are  equal, round, and reactive to light.  Neck:     Trachea: Trachea and phonation normal. No tracheal deviation.  Pulmonary:     Effort: Pulmonary effort is normal. No respiratory distress.  Abdominal:     General: There is no distension.     Palpations: Abdomen is soft.     Tenderness: There is no abdominal tenderness. There is no guarding or rebound.  Genitourinary:    Comments: Chaperone present during genital exam [place] RN.  No external genital lesions noted, no bumps on head of penis, specifically no vesicles concerning for herpes or chancre suggestive of syphilis.  No pain with palpation of the  penis/glans, no discharge or urethritis noted.  Scrotum and testicles without erythema/swelling or tenderness to palpation. Cremasteric reflex intact bilaterally. No palpable hernia noted.  Musculoskeletal:        General: Normal range of motion.     Cervical back: Normal range of motion.  Skin:    General: Skin is warm and dry.     Findings: No rash.  Neurological:     Mental Status: He is alert.     GCS: GCS eye subscore is 4. GCS verbal subscore is 5. GCS motor subscore is 6.     Comments: Speech is clear and goal oriented, follows commands Major Cranial nerves without deficit, no facial droop Moves extremities without ataxia, coordination intact  Psychiatric:        Behavior: Behavior normal.     ED Results / Procedures / Treatments   Labs (all labs ordered are listed, but only abnormal results are displayed) Labs Reviewed  URINALYSIS, ROUTINE W REFLEX MICROSCOPIC - Abnormal; Notable for the following components:      Result Value   Leukocytes,Ua MODERATE (*)    Bacteria, UA RARE (*)    All other components within normal limits  HIV ANTIBODY (ROUTINE TESTING W REFLEX)  RPR  I-STAT CHEM 8, ED  GC/CHLAMYDIA PROBE AMP ([facility]) NOT AT [facility]    EKG None  Radiology No results found.  Procedures Procedures (including critical care time)  Medications Ordered in ED Medications  sterile water (preservative free) injection (has no administration in time range)  cefTRIAXone (ROCEPHIN) injection 500 mg (500 mg Intramuscular Given 02/24/20 1014)  doxycycline (VIBRA-TABS) tablet 100 mg (100 mg Oral Given 02/24/20 1014)  cloNIDine (CATAPRES) tablet 0.1 mg (0.1 mg Oral Given 02/24/20 1038)    ED Course  I have reviewed the triage vital signs and the nursing notes.  Pertinent labs & imaging results that were available during my care of the patient were reviewed by me and considered in my medical decision making (see chart for details).    MDM Rules/Calculators/A&P                       34 year old male presents for penile discharge and dysuria. Physical examination is without abnormality, no signs/symptoms suggestive of epididymitis, orchitis, prostatitis or other acsending infections today. Additionally to rash or lesions.  He has no systemic infectious symptoms otherwise he is feeling well.   Urinalysis shows moderate leukocytes, 21-50 WBCs, 6/10 RBCs, rare bacteria.  UA and symptoms are consistent with STI.  We will treat patient with 500 mg IM Rocephin and discharged with doxycycline 100 mg twice daily x7 days.  Patient advised of pending HIV, RPR, GC chlamydia test and to check them via his MyChart account in the next 2-3 days to discuss results with primary care provider. Patient advised to inform  and treat all sexual partners.  Advised safe sex practices.  Patient encouraged to follow up at local health department for future STI checks. - Additionally patient noted to be hypertensive in triage, he is asymptomatic regarding his elevated blood pressure today.188/132.  He has not taken his blood pressure medication multiple months and is unsure what his previous medication is.  Discussed with Dr. Zenia Resides advises i-STAT Chem-8 if recheck still elevated.  I-STAT Chem-8 within normal limits normal creatinine.  Discussed with Dr. Zenia Resides will give 0.1 mg clonidine and start patient on amlodipine 5 mg daily and refer to community health for a PCP follow-up.  I have reviewed patient's chart and was unable to find his previous blood pressure medication.  At this time there does not appear to be any evidence of an acute emergency medical condition and the patient appears stable for discharge with appropriate outpatient follow up. Diagnosis was discussed with patient who verbalizes understanding of care plan and is agreeable to discharge. I have discussed return precautions with patient who verbalizes understanding. Patient encouraged to follow-up with their PCP. All questions  answered.   Note: Portions of this report may have been transcribed using voice recognition software. Every effort was made to ensure accuracy; however, inadvertent computerized transcription errors may still be present. Final Clinical Impression(s) / ED Diagnoses Final diagnoses:  Urethritis  Asymptomatic hypertension    Rx / DC Orders ED Discharge Orders         Ordered    amLODipine (NORVASC) 5 MG tablet  Daily     02/24/20 1035    doxycycline (VIBRAMYCIN) 100 MG capsule  2 times daily     02/24/20 1047           Gari Crown 02/24/20 1303    Lacretia Leigh, MD 02/25/20 1551

## 2020-02-25 LAB — RPR: RPR Ser Ql: NONREACTIVE

## 2020-02-27 LAB — GC/CHLAMYDIA PROBE AMP (~~LOC~~) NOT AT ARMC
Chlamydia: NEGATIVE
Comment: NEGATIVE
Comment: NORMAL
Neisseria Gonorrhea: NEGATIVE

## 2020-05-11 ENCOUNTER — Other Ambulatory Visit: Payer: Self-pay
